# Patient Record
Sex: Female | Born: 1940 | Race: White | Hispanic: No | Marital: Married | Smoking: Never smoker
Health system: Southern US, Community
[De-identification: ages and names within clinical notes are randomized; demographics above are authoritative.]

---

## 1999-11-27 ENCOUNTER — Other Ambulatory Visit: Admission: RE | Admit: 1999-11-27 | Discharge: 1999-11-27 | Payer: Self-pay | Admitting: Internal Medicine

## 1999-12-09 ENCOUNTER — Encounter: Payer: Self-pay | Admitting: Internal Medicine

## 1999-12-09 ENCOUNTER — Ambulatory Visit (HOSPITAL_COMMUNITY): Admission: RE | Admit: 1999-12-09 | Discharge: 1999-12-09 | Payer: Self-pay | Admitting: Internal Medicine

## 2004-10-09 ENCOUNTER — Ambulatory Visit: Payer: Self-pay | Admitting: Family Medicine

## 2004-10-09 ENCOUNTER — Other Ambulatory Visit: Admission: RE | Admit: 2004-10-09 | Discharge: 2004-10-09 | Payer: Self-pay | Admitting: Family Medicine

## 2004-10-13 ENCOUNTER — Encounter: Admission: RE | Admit: 2004-10-13 | Discharge: 2004-10-13 | Payer: Self-pay | Admitting: Family Medicine

## 2004-10-15 ENCOUNTER — Ambulatory Visit: Payer: Self-pay

## 2005-10-08 ENCOUNTER — Ambulatory Visit: Payer: Self-pay | Admitting: Family Medicine

## 2005-11-25 IMAGING — CR DG CERVICAL SPINE COMPLETE 4+V
6 series · 6 of 6 positions shown · non-contrast
Comparison: None.
COMPARISON: None.

CLINICAL DATA: Paresthesias left head, neck, and shoulder.
 CERVICAL SPINE COMPLETE:

[view not recorded (1 of 6)]
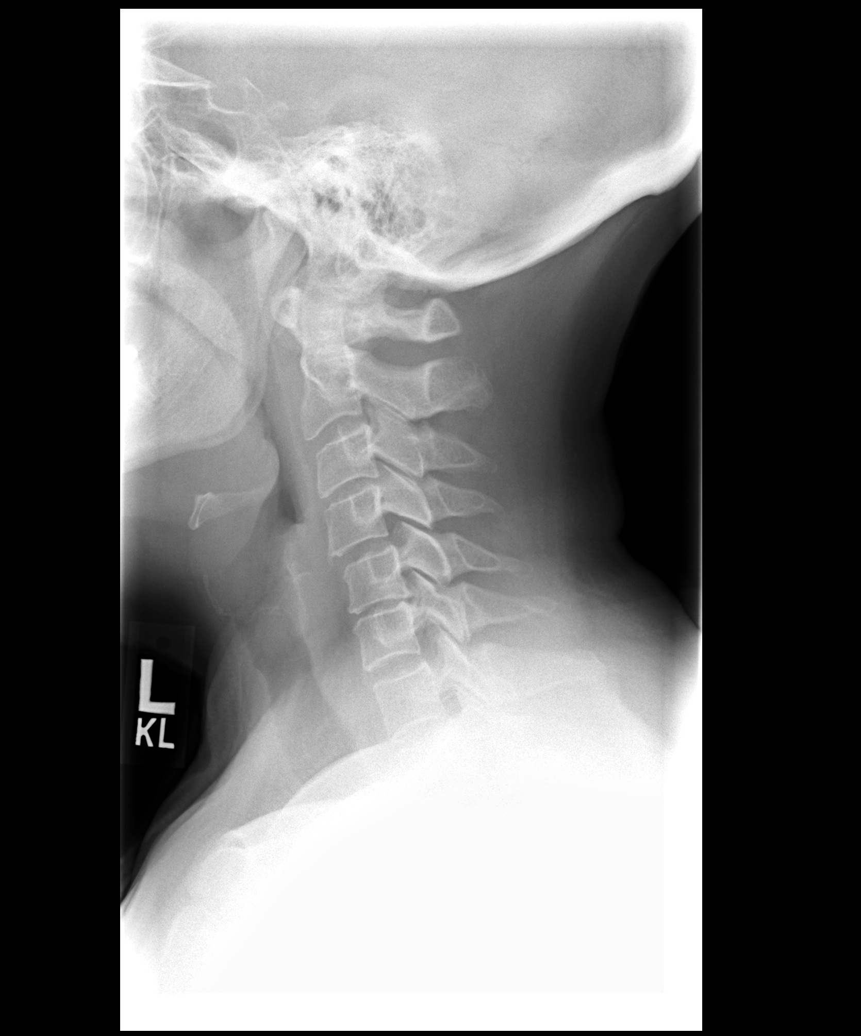

[view not recorded (2 of 6)]
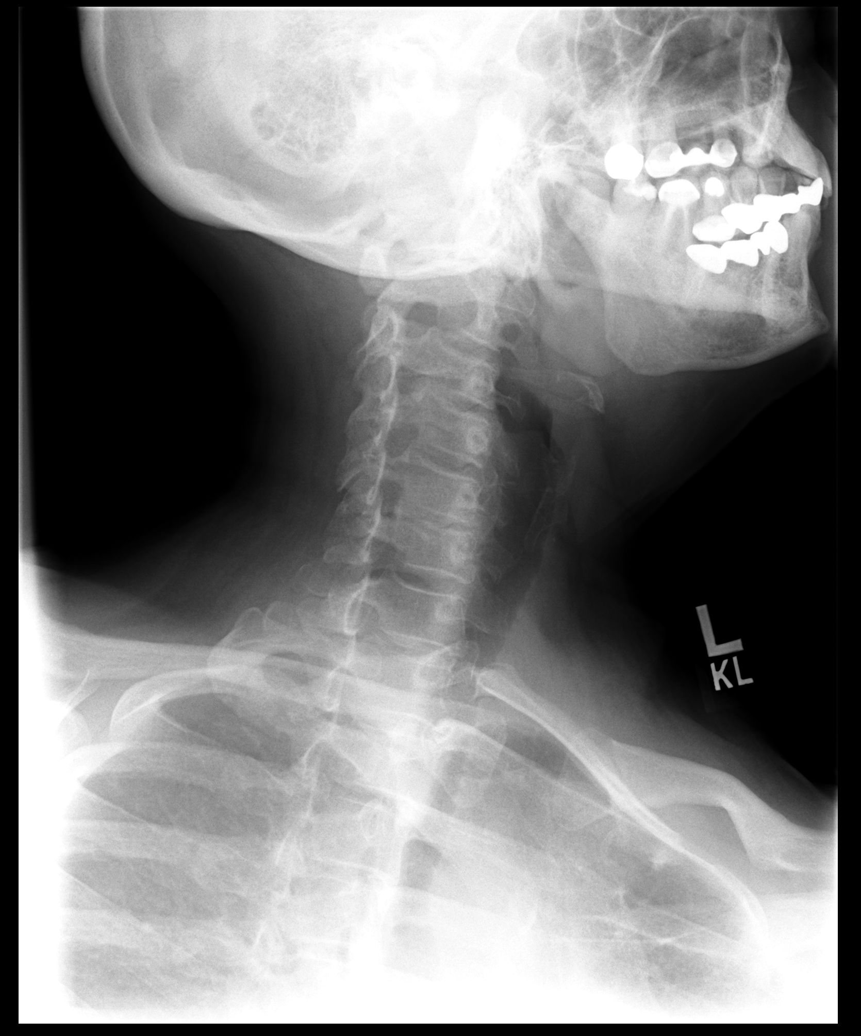

[view not recorded (3 of 6)]
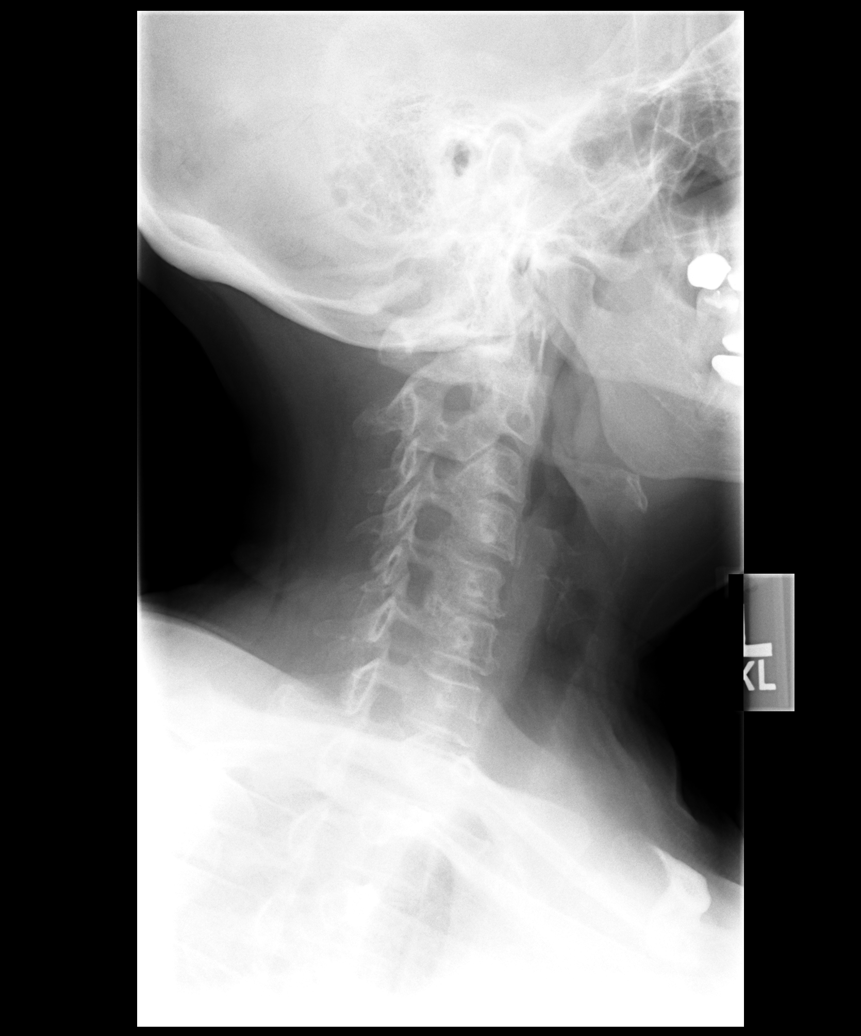

[view not recorded (4 of 6)]
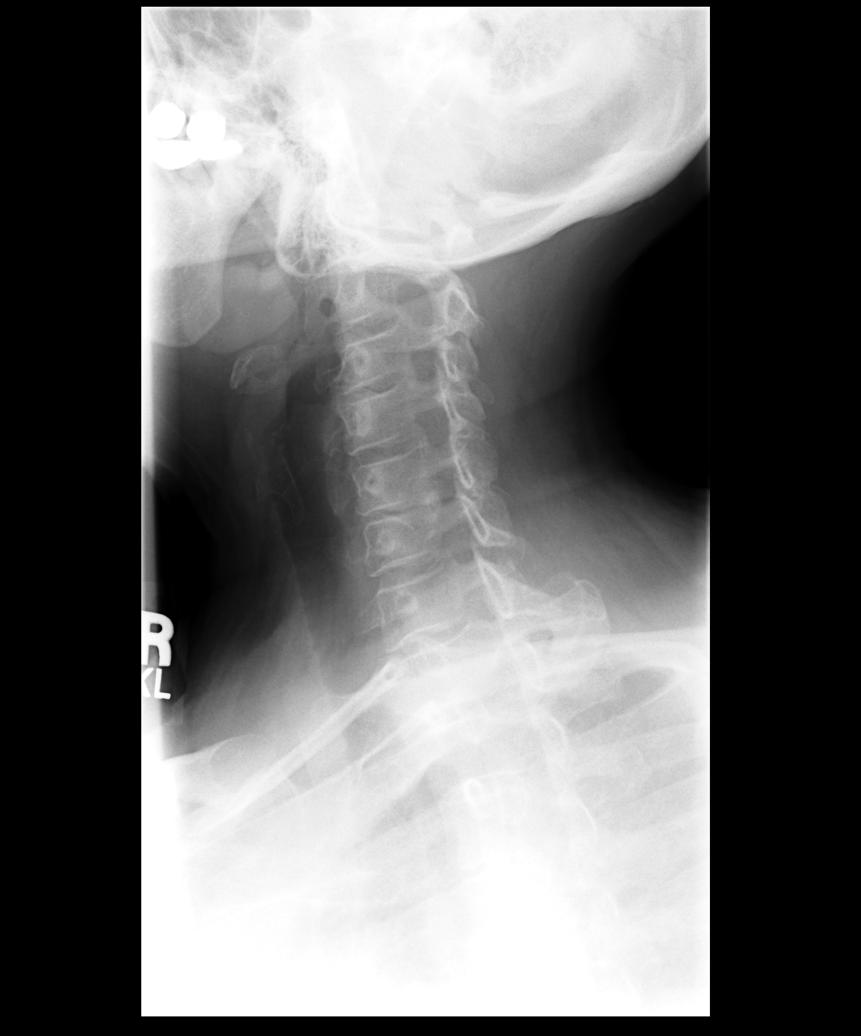

[view not recorded (5 of 6)]
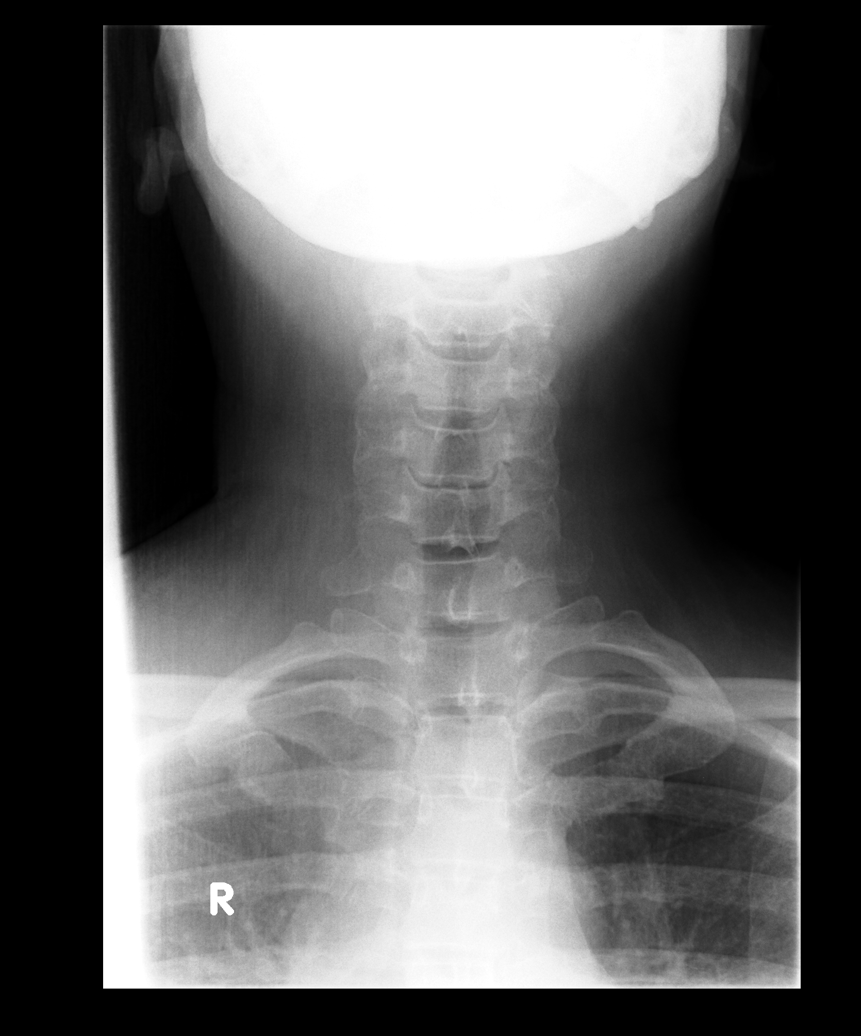

[view not recorded (6 of 6)]
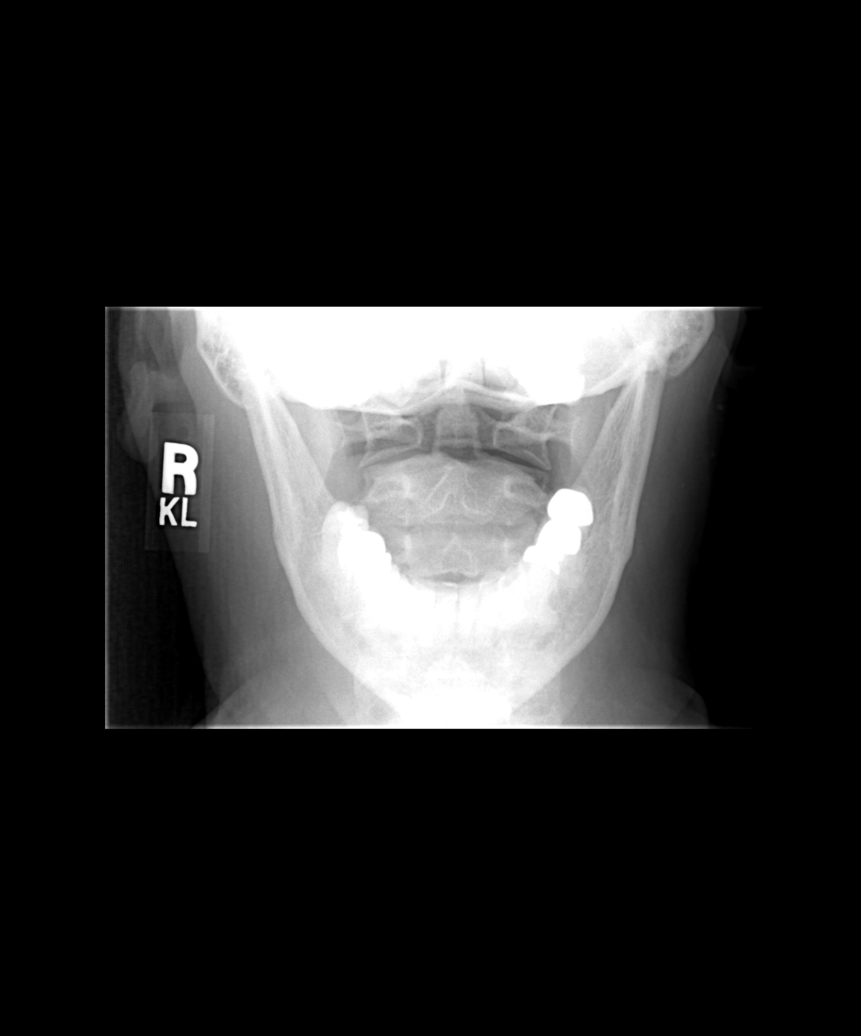

[6 of 6 positions shown; findings below may reference images not displayed]

FINDINGS: There is loss of the normal cervical lordosis.  There is disk narrowing at C5-6, and to a lesser degree at C3-4.  There is no significant foraminal narrowing.  No acute bony abnormality.  Soft tissues normal.
IMPRESSION: Degenerative disk disease at C5-6, and to a lesser degree at C3-4.  No acute changes. 
 CT OF THE BRAIN WITHOUT CONTRAST:
FINDINGS: Routine non-contrast head CT was performed. 

 There is no evidence of intracranial hemorrhage, brain edema, or mass effect. The ventricles are normal. No extra-axial abnormalities are identified. Bone windows show no significant abnormalities.
IMPRESSION: Negative non-contrast head CT.

## 2005-11-25 IMAGING — CT CT HEAD W/O CM
1 series · 16 of 30 positions shown, 20 images · non-contrast
Comparison: None.
COMPARISON: None.

CLINICAL DATA: Paresthesias left head, neck, and shoulder.
 CERVICAL SPINE COMPLETE:

[Series 2: brain · axial · 0.49mm/px · z∈[+36,+184]mm · 16 of 32 slices shown, 20 images]
[im 2/32  brain]
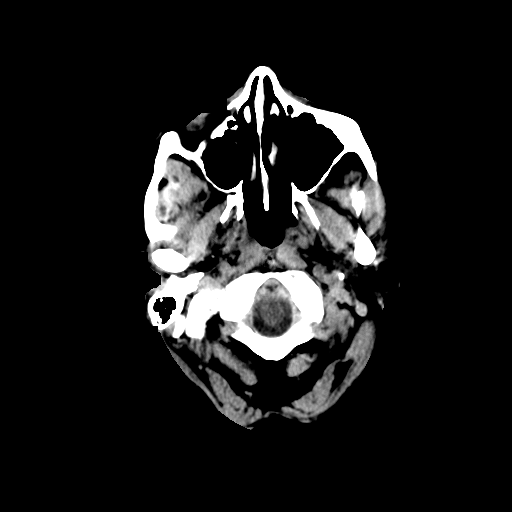
[im 2/32  bone]
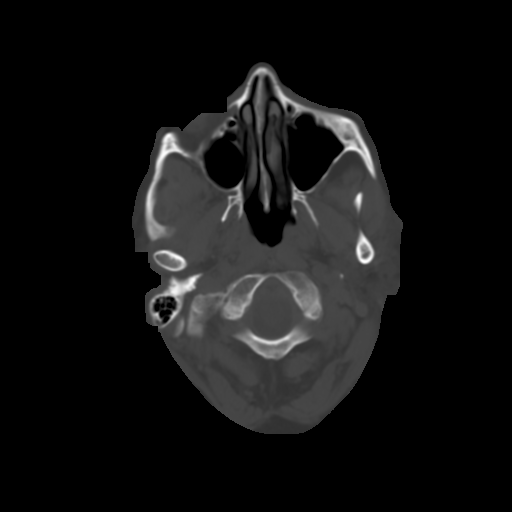
[im 4/32  brain]
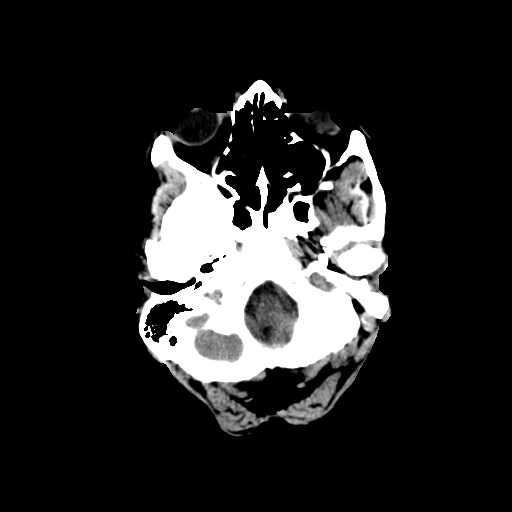
[im 6/32  brain]
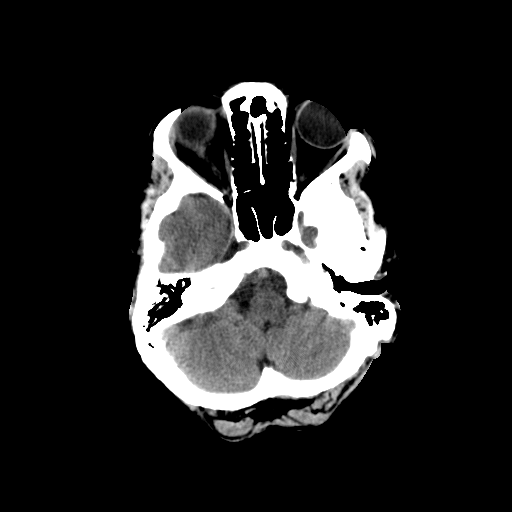
[im 8/32  brain]
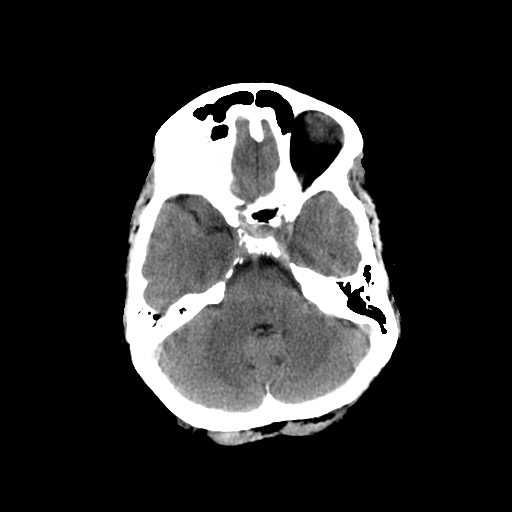
[im 9/32  brain]
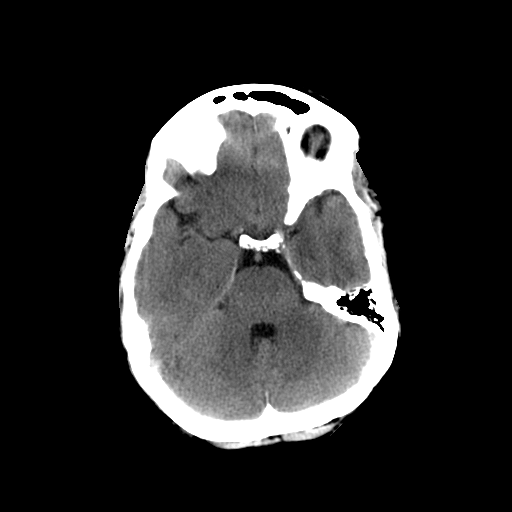
[im 9/32  bone]
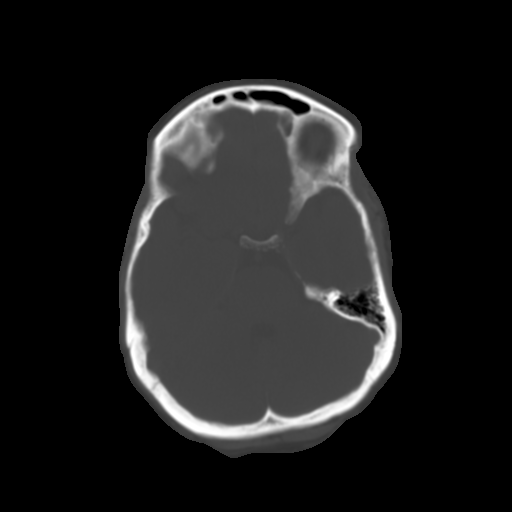
[im 11/32  brain]
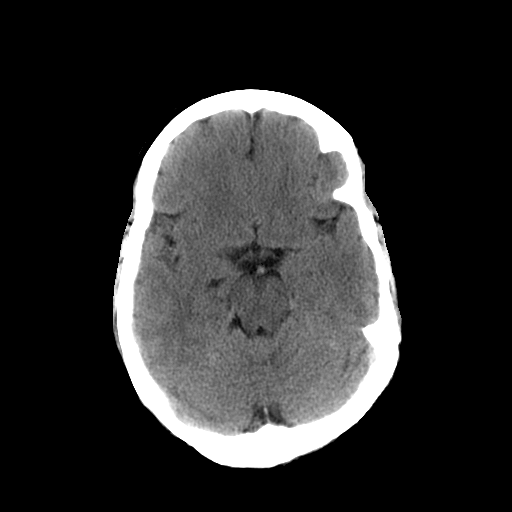
[im 13/32  brain]
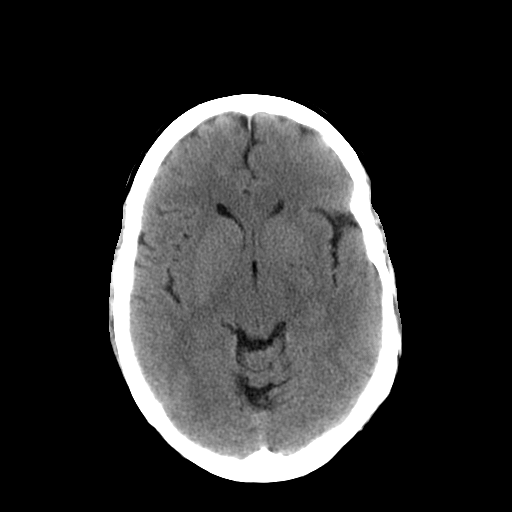
[im 15/32  brain]
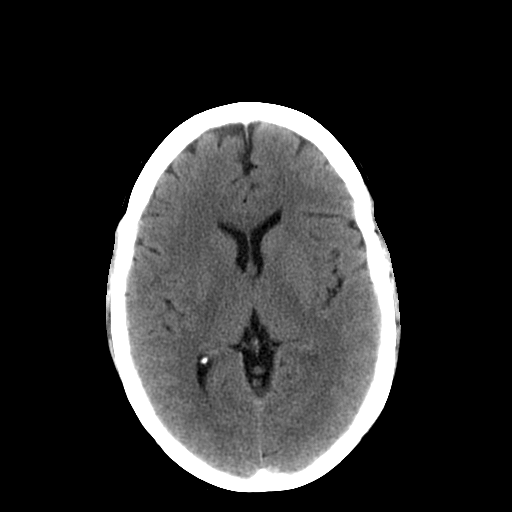
[im 17/32  brain]
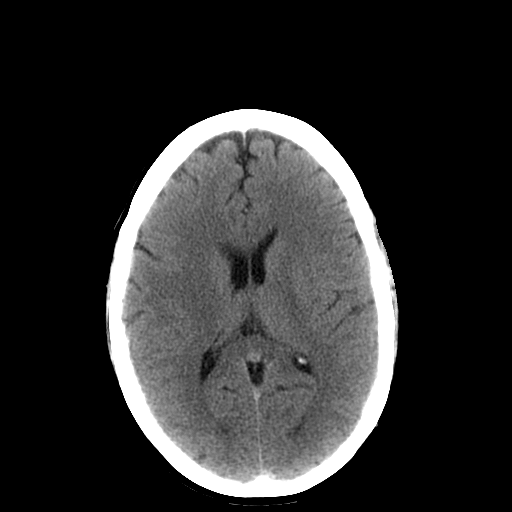
[im 17/32  bone]
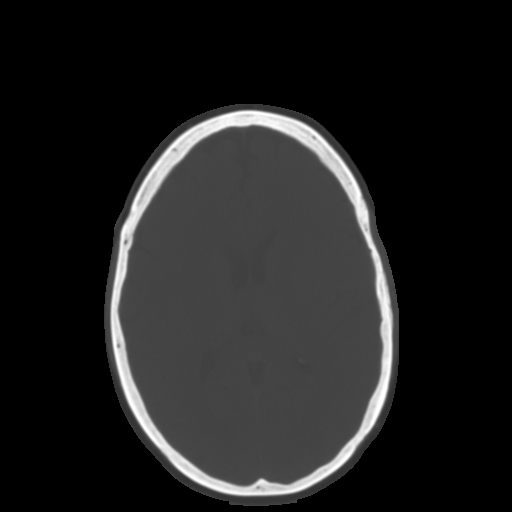
[im 19/32  brain]
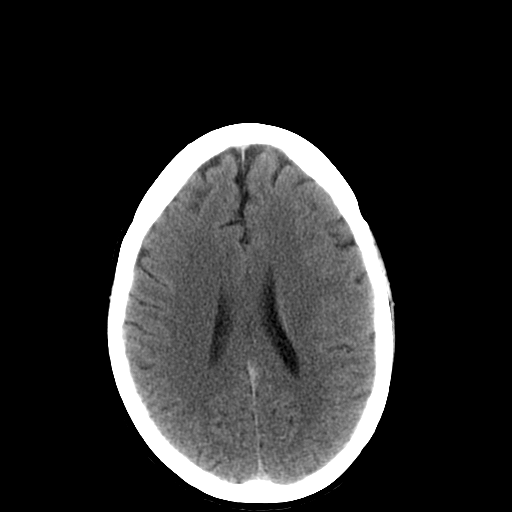
[im 21/32  brain]
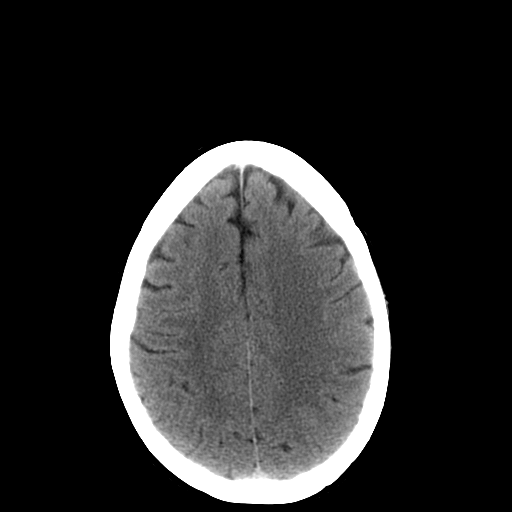
[im 23/32  brain]
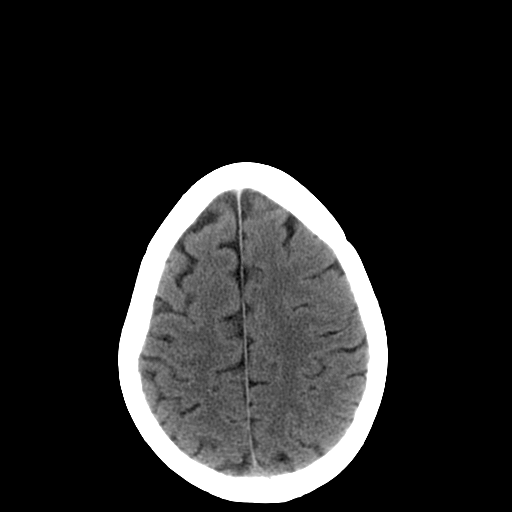
[im 24/32  brain]
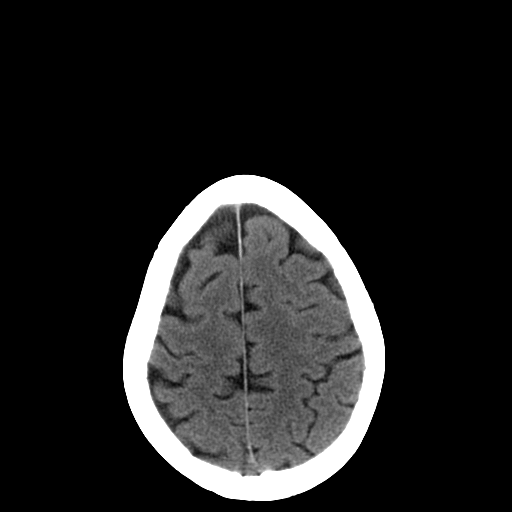
[im 24/32  bone]
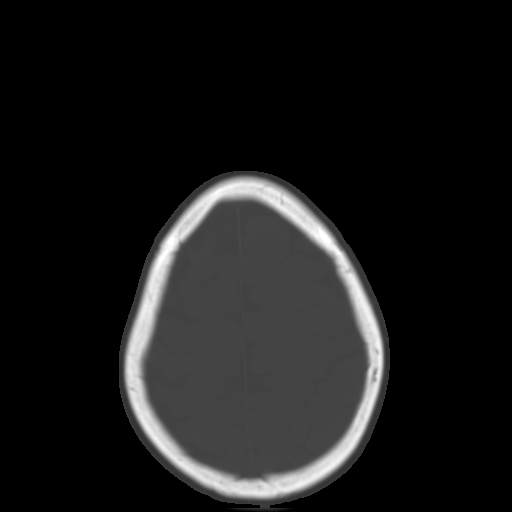
[im 26/32  brain]
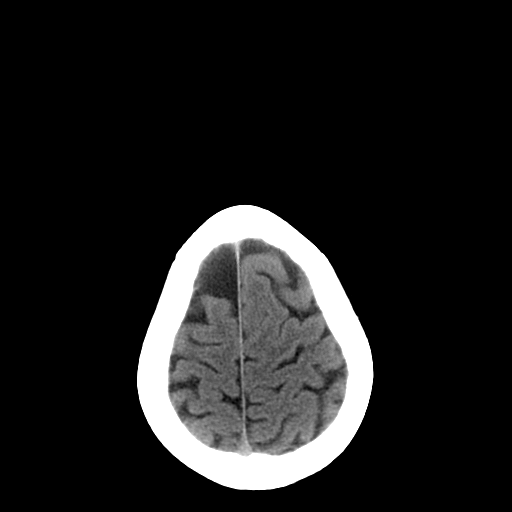
[im 28/32  brain]
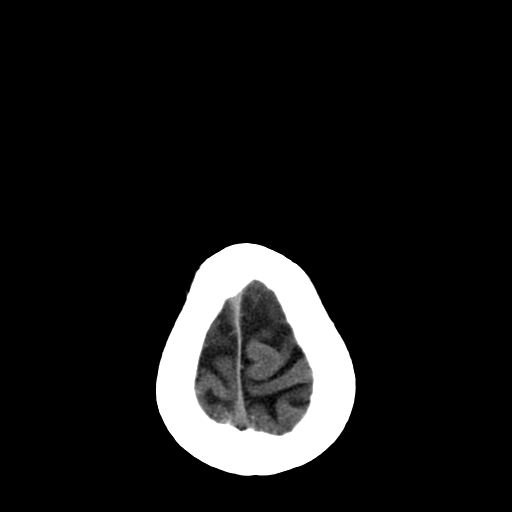
[im 30/32  brain]
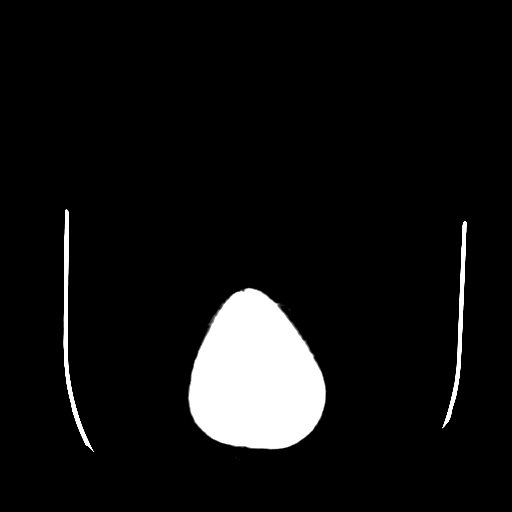

[16 of 30 positions shown; findings below may reference images not displayed]

FINDINGS: There is loss of the normal cervical lordosis.  There is disk narrowing at C5-6, and to a lesser degree at C3-4.  There is no significant foraminal narrowing.  No acute bony abnormality.  Soft tissues normal.
IMPRESSION: Degenerative disk disease at C5-6, and to a lesser degree at C3-4.  No acute changes. 
 CT OF THE BRAIN WITHOUT CONTRAST:
FINDINGS: Routine non-contrast head CT was performed. 

 There is no evidence of intracranial hemorrhage, brain edema, or mass effect. The ventricles are normal. No extra-axial abnormalities are identified. Bone windows show no significant abnormalities.
IMPRESSION: Negative non-contrast head CT.

## 2005-12-02 ENCOUNTER — Other Ambulatory Visit: Admission: RE | Admit: 2005-12-02 | Discharge: 2005-12-02 | Payer: Self-pay | Admitting: Family Medicine

## 2005-12-02 ENCOUNTER — Encounter: Payer: Self-pay | Admitting: Family Medicine

## 2005-12-02 ENCOUNTER — Ambulatory Visit: Payer: Self-pay | Admitting: Family Medicine

## 2006-09-22 ENCOUNTER — Ambulatory Visit: Payer: Self-pay | Admitting: Family Medicine

## 2007-10-27 ENCOUNTER — Ambulatory Visit: Payer: Self-pay | Admitting: Family Medicine

## 2008-09-12 ENCOUNTER — Ambulatory Visit: Payer: Self-pay | Admitting: Family Medicine

## 2009-10-08 ENCOUNTER — Ambulatory Visit: Payer: Self-pay | Admitting: Family Medicine

## 2010-12-14 ENCOUNTER — Encounter: Payer: Self-pay | Admitting: Family Medicine

## 2015-05-09 ENCOUNTER — Emergency Department (HOSPITAL_COMMUNITY)
Admission: EM | Admit: 2015-05-09 | Discharge: 2015-05-09 | Disposition: A | Discharge status: Home / Self Care | Payer: Medicare HMO | Attending: Emergency Medicine | Admitting: Emergency Medicine

## 2015-05-09 ENCOUNTER — Encounter (HOSPITAL_COMMUNITY): Payer: Self-pay

## 2015-05-09 DIAGNOSIS — Y9389 Activity, other specified: Secondary | ICD-10-CM | POA: Insufficient documentation

## 2015-05-09 DIAGNOSIS — Y998 Other external cause status: Secondary | ICD-10-CM | POA: Diagnosis not present

## 2015-05-09 DIAGNOSIS — Z79899 Other long term (current) drug therapy: Secondary | ICD-10-CM | POA: Insufficient documentation

## 2015-05-09 DIAGNOSIS — W5503XA Scratched by cat, initial encounter: Secondary | ICD-10-CM | POA: Insufficient documentation

## 2015-05-09 DIAGNOSIS — Y9289 Other specified places as the place of occurrence of the external cause: Secondary | ICD-10-CM | POA: Diagnosis not present

## 2015-05-09 DIAGNOSIS — Z7982 Long term (current) use of aspirin: Secondary | ICD-10-CM | POA: Insufficient documentation

## 2015-05-09 DIAGNOSIS — S0501XA Injury of conjunctiva and corneal abrasion without foreign body, right eye, initial encounter: Secondary | ICD-10-CM

## 2015-05-09 DIAGNOSIS — S0591XA Unspecified injury of right eye and orbit, initial encounter: Secondary | ICD-10-CM | POA: Diagnosis present

## 2015-05-09 DIAGNOSIS — Z23 Encounter for immunization: Secondary | ICD-10-CM | POA: Insufficient documentation

## 2015-05-09 MED ORDER — AMOXICILLIN-POT CLAVULANATE 875-125 MG PO TABS: 1.0000 | ORAL_TABLET | Freq: Two times a day (BID) | ORAL | Status: AC

## 2015-05-09 MED ORDER — FLUORESCEIN SODIUM 1 MG OP STRP
1.0000 | ORAL_STRIP | Freq: Once | OPHTHALMIC | Status: AC
  Administered 2015-05-09: 1 via OPHTHALMIC
  Filled 2015-05-09: qty 1

## 2015-05-09 MED ORDER — TETANUS-DIPHTH-ACELL PERTUSSIS 5-2.5-18.5 LF-MCG/0.5 IM SUSP
0.5000 mL | Freq: Once | INTRAMUSCULAR | Status: AC
  Administered 2015-05-09: 0.5 mL via INTRAMUSCULAR
  Filled 2015-05-09: qty 0.5

## 2015-05-09 MED ORDER — CIPROFLOXACIN HCL 0.3 % OP SOLN: 2.0000 [drp] | OPHTHALMIC | Status: AC

## 2015-05-09 MED ORDER — TETRACAINE HCL 0.5 % OP SOLN
2.0000 [drp] | Freq: Once | OPHTHALMIC | Status: AC
  Administered 2015-05-09: 2 [drp] via OPHTHALMIC
  Filled 2015-05-09: qty 2

## 2015-05-09 NOTE — ED Provider Notes (Signed)
CSN: [id number]     Arrival date & time 05/09/15  0405 History   First MD Initiated Contact with Patient 05/09/15 0606     Chief Complaint  Patient presents with  . Eye Injury     (Consider location/radiation/quality/duration/timing/severity/associated sxs/prior Treatment) HPI Comments: Patient presents today with pain of her right eye.  She states that her cat clawed her right cheek and right eye around 3 AM this morning.  She reports that she had a small amount of bleeding initially, which has since resolved.  She reports that her cats immunizations are UTD.  Patient's tetanus is not UTD.  She denies vision changes, eye drainage, fever, chills, nausea, or vomiting. No treatment prior to arrival.   Patient is a 74 y.o. female presenting with eye injury. The history is provided by the patient.  Eye Injury    No past medical history on file. History reviewed. No pertinent past surgical history. No family history on file. History  Substance Use Topics  . Smoking status: Never Smoker   . Smokeless tobacco: Not on file  . Alcohol Use: No   OB History    No data available     Review of Systems  All other systems reviewed and are negative.     Allergies  Codeine; Dairy aid; and Other  Home Medications   Prior to Admission medications   Medication Sig Start Date End Date Taking? Authorizing Provider  Ascorbic Acid (VITAMIN C) 100 MG tablet Take 500 mg by mouth daily.   Yes Historical Provider, MD  aspirin 325 MG tablet Take 325 mg by mouth every 4 (four) hours as needed for mild pain.   Yes Historical Provider, MD  calcium-vitamin D (OSCAL WITH D) 250-125 MG-UNIT per tablet Take 1 tablet by mouth daily.   Yes Historical Provider, MD  Multiple Vitamin (MULTIVITAMIN WITH MINERALS) TABS tablet Take 1 tablet by mouth daily.   Yes Historical Provider, MD   BP 139/65 mmHg  Pulse 91  Temp(Src) 98.5 F (36.9 C) (Oral)  Resp 18  Ht  (1.651 m)  Wt 190 lb (86.183 kg)  BMI  31.62 kg/m2  SpO2 98% Physical Exam  Constitutional: She appears well-developed and well-nourished.  HENT:  Head: Normocephalic and atraumatic.  Patient with superficial linear abrasions to the right cheek.  No active bleeding.  Eyes: EOM and lids are normal. Pupils are equal, round, and reactive to light. Lids are everted and swept, no foreign bodies found. Right eye exhibits no discharge and no exudate. Left eye exhibits no discharge and no exudate.  Injection of the medial portion of the sclera of the right eye    Visual Acuity  Right Eye Distance:   Left Eye Distance: 20/50 Bilateral Distance: 20/40       Neck: Normal range of motion. Neck supple.  Cardiovascular: Normal rate, regular rhythm and normal heart sounds.   Pulmonary/Chest: Effort normal and breath sounds normal.  Neurological: She is alert.  Skin: Skin is warm and dry.  Psychiatric: She has a normal mood and affect.  Nursing note and vitals reviewed.   ED Course  Procedures (including critical care time) Labs Review Labs Reviewed - No data to display  Imaging Review No results found.   EKG Interpretation None      MDM   Final diagnoses:  None   Patient presents today with pain of her right eye after her cat scratched her face and clawed her eye.  Exam showing a corneal abrasion  of the right eye.  Patient reports that all of the cat's immunizations are UTD.  Patient's tetanus updated in the ED.  Patient placed on antibiotic eye drops and also Augmentin.  Stable for discharge.  Patient also seen by Dr. Mora Bellman who is in agreement with the plan.  Return precautions given.      Santiago Glad, PA-C 05/10/15 1457  Tomasita Crumble, MD 05/10/15 [phone]

## 2015-05-09 NOTE — ED Notes (Signed)
Patient was lying in her bed asleep when her cat clawed the right side of her face.  Redness noted to right eye and cheek.  Patient reports she had bleeding from eye and cheek area that she stopped at home.

## 2018-05-11 DIAGNOSIS — M62830 Muscle spasm of back: Secondary | ICD-10-CM | POA: Diagnosis not present

## 2018-05-11 DIAGNOSIS — M5416 Radiculopathy, lumbar region: Secondary | ICD-10-CM | POA: Diagnosis not present

## 2018-05-11 DIAGNOSIS — M9902 Segmental and somatic dysfunction of thoracic region: Secondary | ICD-10-CM | POA: Diagnosis not present

## 2018-05-11 DIAGNOSIS — M9905 Segmental and somatic dysfunction of pelvic region: Secondary | ICD-10-CM | POA: Diagnosis not present

## 2018-05-11 DIAGNOSIS — M9903 Segmental and somatic dysfunction of lumbar region: Secondary | ICD-10-CM | POA: Diagnosis not present

## 2019-08-24 DIAGNOSIS — R69 Illness, unspecified: Secondary | ICD-10-CM | POA: Diagnosis not present

## 2020-10-10 DIAGNOSIS — R69 Illness, unspecified: Secondary | ICD-10-CM | POA: Diagnosis not present
